# Patient Record
Sex: Female | Born: 1975 | Race: Asian | Hispanic: No | Marital: Married | Smoking: Never smoker
Health system: Southern US, Community
[De-identification: ages and names within clinical notes are randomized; demographics above are authoritative.]

## PROBLEM LIST (undated history)

## (undated) DIAGNOSIS — T7840XA Allergy, unspecified, initial encounter: Secondary | ICD-10-CM

## (undated) HISTORY — PX: BREAST EXCISIONAL BIOPSY: SUR124

## (undated) HISTORY — DX: Allergy, unspecified, initial encounter: T78.40XA

---

## 2002-02-22 ENCOUNTER — Other Ambulatory Visit: Admission: RE | Admit: 2002-02-22 | Discharge: 2002-02-22 | Payer: Self-pay | Admitting: Obstetrics and Gynecology

## 2003-03-08 ENCOUNTER — Other Ambulatory Visit: Admission: RE | Admit: 2003-03-08 | Discharge: 2003-03-08 | Payer: Self-pay | Admitting: Obstetrics and Gynecology

## 2003-08-19 ENCOUNTER — Encounter: Admission: RE | Admit: 2003-08-19 | Discharge: 2003-08-19 | Payer: Self-pay | Admitting: Family Medicine

## 2003-12-05 ENCOUNTER — Encounter: Admission: RE | Admit: 2003-12-05 | Discharge: 2003-12-05 | Payer: Self-pay | Admitting: Family Medicine

## 2009-06-26 ENCOUNTER — Inpatient Hospital Stay (HOSPITAL_COMMUNITY): Admission: AD | Admit: 2009-06-26 | Discharge: 2009-06-26 | Payer: Self-pay | Admitting: Obstetrics and Gynecology

## 2009-06-26 ENCOUNTER — Ambulatory Visit: Payer: Self-pay | Admitting: Nurse Practitioner

## 2009-08-18 ENCOUNTER — Inpatient Hospital Stay (HOSPITAL_COMMUNITY): Admission: AD | Admit: 2009-08-18 | Discharge: 2009-08-20 | Payer: Self-pay | Admitting: Obstetrics and Gynecology

## 2010-01-27 ENCOUNTER — Encounter: Payer: Self-pay | Admitting: Family Medicine

## 2010-03-23 LAB — CBC
HCT: 31.3 % — ABNORMAL LOW (ref 36.0–46.0)
HCT: 37.5 % (ref 36.0–46.0)
Hemoglobin: 10.6 g/dL — ABNORMAL LOW (ref 12.0–15.0)
Hemoglobin: 12.5 g/dL (ref 12.0–15.0)
MCH: 28.2 pg (ref 26.0–34.0)
MCH: 28.7 pg (ref 26.0–34.0)
MCHC: 33.3 g/dL (ref 30.0–36.0)
MCHC: 33.7 g/dL (ref 30.0–36.0)
MCV: 84.6 fL (ref 78.0–100.0)
MCV: 85 fL (ref 78.0–100.0)
Platelets: 171 10*3/uL (ref 150–400)
Platelets: 199 10*3/uL (ref 150–400)
RBC: 3.69 MIL/uL — ABNORMAL LOW (ref 3.87–5.11)
RBC: 4.43 MIL/uL (ref 3.87–5.11)
RDW: 14.6 % (ref 11.5–15.5)
RDW: 14.7 % (ref 11.5–15.5)
WBC: 10 10*3/uL (ref 4.0–10.5)
WBC: 9.6 10*3/uL (ref 4.0–10.5)

## 2010-03-23 LAB — RPR: RPR Ser Ql: NONREACTIVE

## 2010-03-25 LAB — FETAL FIBRONECTIN: Fetal Fibronectin: NEGATIVE

## 2012-09-06 ENCOUNTER — Ambulatory Visit (INDEPENDENT_AMBULATORY_CARE_PROVIDER_SITE_OTHER): Payer: BC Managed Care – PPO | Admitting: Family Medicine

## 2012-09-06 VITALS — BP 96/58 | HR 55 | Temp 98.8°F | Resp 16 | Ht 61.5 in | Wt 111.2 lb

## 2012-09-06 DIAGNOSIS — M26629 Arthralgia of temporomandibular joint, unspecified side: Secondary | ICD-10-CM

## 2012-09-06 DIAGNOSIS — M2669 Other specified disorders of temporomandibular joint: Secondary | ICD-10-CM

## 2012-09-06 MED ORDER — MELOXICAM 7.5 MG PO TABS: 7.5000 mg | ORAL_TABLET | Freq: Every day | ORAL | Status: DC

## 2012-09-06 NOTE — Progress Notes (Signed)
37 year old woman from [place]-does finger nail care comes in with 4 months of progressive bilateral TMJ pain. She says it hurts when she opens up her jaw but has no problem taking down. She's had some ear discomfort as well but no loss of hearing or dizziness.  Patient denies any trauma. She denies any fever. She denies any dental pain as well.  Objective: Normal dentition, full range of motion of jaw, minimally tender TMJ. Normal external ears and tympanic membranes Neck: Normal  Assessment: TMJ syndrome TMJ syndrome - Plan: meloxicam (MOBIC) 7.5 MG tablet  Signed, Elvina Sidle, MD

## 2012-09-06 NOTE — Patient Instructions (Signed)
Temporomandibular Problems  Temporomandibular joint (TMJ) dysfunction means there are problems with the joint between your jaw and your skull. This is a joint lined by cartilage like other joints in your body but also has a small disc in the joint which keeps the bones from rubbing on each other. These joints are like other joints and can get inflamed (sore) from arthritis and other problems. When this joint gets sore, it can cause headaches and pain in the jaw and the face. CAUSES  Usually the arthritic types of problems are caused by soreness in the joint. Soreness in the joint can also be caused by overuse. This may come from grinding your teeth. It may also come from mis-alignment in the joint. DIAGNOSIS Diagnosis of this condition can often be made by history and exam. Sometimes your caregiver may need X-rays or an MRI scan to determine the exact cause. It may be necessary to see your dentist to determine if your teeth and jaws are lined up correctly. TREATMENT  Most of the time this problem is not serious; however, sometimes it can persist (become chronic). When this happens medications that will cut down on inflammation (soreness) help. Sometimes a shot of cortisone into the joint will be helpful. If your teeth are not aligned it may help for your dentist to make a splint for your mouth that can help this problem. If no physical problems can be found, the problem may come from tension. If tension is found to be the cause, biofeedback or relaxation techniques may be helpful. HOME CARE INSTRUCTIONS   Later in the day, applications of ice packs may be helpful. Ice can be used in a plastic bag with a towel around it to prevent frostbite to skin. This may be used about every 2 hours for 20 to 30 minutes, as needed while awake, or as directed by your caregiver.  Only take over-the-counter or prescription medicines for pain, discomfort, or fever as directed by your caregiver.  If physical therapy was  prescribed, follow your caregiver's directions.  Wear mouth appliances as directed if they were given. Document Released: 09/18/2000 Document Revised: 03/18/2011 Document Reviewed: 12/27/2007 [employer] Patient Information 2014 [employer].  

## 2016-02-28 ENCOUNTER — Other Ambulatory Visit: Payer: Self-pay | Admitting: Obstetrics

## 2016-02-28 DIAGNOSIS — R928 Other abnormal and inconclusive findings on diagnostic imaging of breast: Secondary | ICD-10-CM

## 2016-03-01 ENCOUNTER — Ambulatory Visit
Admission: RE | Admit: 2016-03-01 | Discharge: 2016-03-01 | Disposition: A | Discharge status: Home / Self Care | Payer: Self-pay | Source: Ambulatory Visit | Attending: Obstetrics | Admitting: Obstetrics

## 2016-03-01 DIAGNOSIS — R928 Other abnormal and inconclusive findings on diagnostic imaging of breast: Secondary | ICD-10-CM

## 2016-10-22 ENCOUNTER — Ambulatory Visit (INDEPENDENT_AMBULATORY_CARE_PROVIDER_SITE_OTHER): Payer: BLUE CROSS/BLUE SHIELD

## 2016-10-22 ENCOUNTER — Encounter: Payer: Self-pay | Admitting: Family Medicine

## 2016-10-22 ENCOUNTER — Ambulatory Visit (INDEPENDENT_AMBULATORY_CARE_PROVIDER_SITE_OTHER): Payer: BLUE CROSS/BLUE SHIELD | Admitting: Family Medicine

## 2016-10-22 VITALS — BP 100/62 | HR 56 | Temp 98.2°F | Resp 18 | Ht 62.44 in | Wt 106.2 lb

## 2016-10-22 DIAGNOSIS — R071 Chest pain on breathing: Secondary | ICD-10-CM

## 2016-10-22 MED ORDER — RANITIDINE HCL 150 MG PO TABS: 150.0000 mg | ORAL_TABLET | Freq: Two times a day (BID) | ORAL | 1 refills | Status: AC

## 2016-10-22 NOTE — Patient Instructions (Addendum)
   IF you received an x-ray today, you will receive an invoice from [facility]. Please contact [facility] at [phone] with questions or concerns regarding your invoice.   IF you received labwork today, you will receive an invoice from [facility]. Please contact [facility] at [phone] with questions or concerns regarding your invoice.   Our billing staff will not be able to assist you with questions regarding bills from these companies.  You will be contacted with the lab results as soon as they are available. The fastest way to get your results is to activate your My Chart account. Instructions are located on the last page of this paperwork. If you have not heard from us regarding the results in 2 weeks, please contact this office.     Nonspecific Chest Pain Chest pain can be caused by many different conditions. There is always a chance that your pain could be related to something serious, such as a heart attack or a blood clot in your lungs. Chest pain can also be caused by conditions that are not life-threatening. If you have chest pain, it is very important to follow up with your health care provider. What are the causes? Causes of this condition include:  Heartburn.  Pneumonia or bronchitis.  Anxiety or stress.  Inflammation around your heart (pericarditis) or lung (pleuritis or pleurisy).  A blood clot in your lung.  A collapsed lung (pneumothorax). This can develop suddenly on its own (spontaneous pneumothorax) or from trauma to the chest.  Shingles infection (varicella-zoster virus).  Heart attack.  Damage to the bones, muscles, and cartilage that make up your chest wall. This can include: ? Bruised bones due to injury. ? Strained muscles or cartilage due to frequent or repeated coughing or overwork. ? Fracture to one or more ribs. ? Sore cartilage due to inflammation (costochondritis).  What increases the risk? Risk factors for this condition  may include:  Activities that increase your risk for trauma or injury to your chest.  Respiratory infections or conditions that cause frequent coughing.  Medical conditions or overeating that can cause heartburn.  Heart disease or family history of heart disease.  Conditions or health behaviors that increase your risk of developing a blood clot.  Having had chicken pox (varicella zoster).  What are the signs or symptoms? Chest pain can feel like:  Burning or tingling on the surface of your chest or deep in your chest.  Crushing, pressure, aching, or squeezing pain.  Dull or sharp pain that is worse when you move, cough, or take a deep breath.  Pain that is also felt in your back, neck, shoulder, or arm, or pain that spreads to any of these areas.  Your chest pain may come and go, or it may stay constant. How is this diagnosed? Lab tests or other studies may be needed to find the cause of your pain. Your health care provider may have you take a test called an ECG (electrocardiogram). An ECG records your heartbeat patterns at the time the test is performed. You may also have other tests, such as:  Transthoracic echocardiogram (TTE). In this test, sound waves are used to create a picture of the heart structures and to look at how blood flows through your heart.  Transesophageal echocardiogram (TEE).This is a more advanced imaging test that takes images from inside your body. It allows your health care provider to see your heart in finer detail.  Cardiac monitoring. This allows your health care provider to   monitor your heart rate and rhythm in real time.  Holter monitor. This is a portable device that records your heartbeat and can help to diagnose abnormal heartbeats. It allows your health care provider to track your heart activity for several days, if needed.  Stress tests. These can be done through exercise or by taking medicine that makes your heart beat more quickly.  Blood  tests.  Other imaging tests.  How is this treated? Treatment depends on what is causing your chest pain. Treatment may include:  Medicines. These may include: ? Acid blockers for heartburn. ? Anti-inflammatory medicine. ? Pain medicine for inflammatory conditions. ? Antibiotic medicine, if an infection is present. ? Medicines to dissolve blood clots. ? Medicines to treat coronary artery disease (CAD).  Supportive care for conditions that do not require medicines. This may include: ? Resting. ? Applying heat or cold packs to injured areas. ? Limiting activities until pain decreases.  Follow these instructions at home: Medicines  If you were prescribed an antibiotic, take it as told by your health care provider. Do not stop taking the antibiotic even if you start to feel better.  Take over-the-counter and prescription medicines only as told by your health care provider. Lifestyle  Do not use any products that contain nicotine or tobacco, such as cigarettes and e-cigarettes. If you need help quitting, ask your health care provider.  Do not drink alcohol.  Make lifestyle changes as directed by your health care provider. These may include: ? Getting regular exercise. Ask your health care provider to suggest some activities that are safe for you. ? Eating a heart-healthy diet. A registered dietitian can help you to learn healthy eating options. ? Maintaining a healthy weight. ? Managing diabetes, if necessary. ? Reducing stress, such as with yoga or relaxation techniques. General instructions  Avoid any activities that bring on chest pain.  If heartburn is the cause for your chest pain, raise (elevate) the head of your bed about 6 inches (15 cm) by putting blocks under the legs. Sleeping with more pillows does not effectively relieve heartburn because it only changes the position of your head.  Keep all follow-up visits as told by your health care provider. This is important.  This includes any further testing if your chest pain does not go away. Contact a health care provider if:  Your chest pain does not go away.  You have a rash with blisters on your chest.  You have a fever.  You have chills. Get help right away if:  Your chest pain is worse.  You have a cough that gets worse, or you cough up blood.  You have severe pain in your abdomen.  You have severe weakness.  You faint.  You have sudden, unexplained chest discomfort.  You have sudden, unexplained discomfort in your arms, back, neck, or jaw.  You have shortness of breath at any time.  You suddenly start to sweat, or your skin gets clammy.  You feel nauseous or you vomit.  You suddenly feel light-headed or dizzy.  Your heart begins to beat quickly, or it feels like it is skipping beats. These symptoms may represent a serious problem that is an emergency. Do not wait to see if the symptoms will go away. Get medical help right away. Call your local emergency services (911 in the [place] Do not drive yourself to the hospital. This information is not intended to replace advice given to you by your health care provider. Make sure you   discuss any questions you have with your health care provider. Document Released: 10/03/2004 Document Revised: 09/18/2015 Document Reviewed: 09/18/2015 Elsevier Interactive Patient Education  2017 [employer]  

## 2016-10-22 NOTE — Progress Notes (Signed)
10/16/20189:03 AM  Misty Perkins 1975-11-15, 41 y.o. female [id number]  Chief Complaint  Patient presents with  . Chest Pain    X 2 weeks off and on    HPI:   Patient is a 41 y.o. female with no past medical history  who presents today for intermittent chest pressure, midsternal/upper left with deep breathing or sometimes when she wakes up. Denies any symptoms with exertion. Radiates to her back. Self -resolves, fleeting. Sometimes she gets dizzy, she denies any nausea or diaphoresis, She occ has a dry nocturnal cough. Asthma in childhood. Denies heartburn, abd pain, changes in BM, blood in stool. She occ has palpitations but has not associated it with chest pressure. Long car trip 2-3 months ago. Denies any leg edema. Has not tried anything for this. Denies any recent illnesses.   Depression screen PHQ 2/9 10/22/2016  Decreased Interest 0  Down, Depressed, Hopeless 0  PHQ - 2 Score 0    No Known Allergies  Prior to Admission medications   Not on File    Past Medical History:  Diagnosis Date  . Allergy     Past Surgical History:  Procedure Laterality Date  . BREAST EXCISIONAL BIOPSY Right     Social History  Substance Use Topics  . Smoking status: Never Smoker  . Smokeless tobacco: Never Used  . Alcohol use No    Family History  Problem Relation Age of Onset  . Hypertension Father     Review of Systems  Constitutional: Negative for chills, diaphoresis and fever.  Respiratory: Positive for cough and shortness of breath. Negative for hemoptysis, sputum production and wheezing.   Cardiovascular: Positive for chest pain and palpitations. Negative for leg swelling.  Gastrointestinal: Negative for abdominal pain, blood in stool, constipation, diarrhea, heartburn, nausea and vomiting.  Neurological: Positive for dizziness. Negative for sensory change, speech change and focal weakness.     OBJECTIVE:  Blood pressure 100/62, pulse (!) 56, temperature 98.2 F  (36.8 C), temperature source Temporal, resp. rate 18, height 5' 2.44" (1.586 m), weight 106 lb 3.2 oz (48.2 kg), last menstrual period 10/20/2016, SpO2 98 %. Compared to Vital signs in 2014, BP and HR stable  Physical Exam  Constitutional: She is oriented to person, place, and time and well-developed, well-nourished, and in no distress.  HENT:  Head: Normocephalic and atraumatic.  Mouth/Throat: Oropharynx is clear and moist. No oropharyngeal exudate.  Eyes: Pupils are equal, round, and reactive to light. EOM are normal. No scleral icterus.  Neck: Neck supple. No thyromegaly present.  Cardiovascular: Normal rate, regular rhythm, normal heart sounds and intact distal pulses.  Exam reveals no gallop and no friction rub.   No murmur heard. Pulmonary/Chest: Effort normal and breath sounds normal. She has no wheezes. She has no rales. She exhibits no tenderness.  Abdominal: Soft. Bowel sounds are normal. She exhibits no distension and no mass. There is no tenderness.  Musculoskeletal: She exhibits no edema.  Lymphadenopathy:    She has no cervical adenopathy.  Neurological: She is alert and oriented to person, place, and time. Gait normal.  Skin: Skin is warm and dry.     Dg Chest 2 View  Result Date: 10/22/2016 CLINICAL DATA:  Intermittent chest pressure. EXAM: CHEST  2 VIEW COMPARISON:  No recent. FINDINGS: Mediastinum and hilar structures normal. Lungs are clear. Heart size normal. No pleural effusion or pneumothorax. IMPRESSION: No acute cardiopulmonary disease . Electronically Signed   By: Maisie Fus  Register   On: 10/22/2016  09:38    EKG: sinus brady, HR 50   ASSESSMENT and PLAN  1. Chest pain on breathing Exam, CXR and EKG normal. Discussed atypical causes of CP, trial of H2B, discussed diet. Labs pending, fu if needed per labs. Discussed ER precautions.   - EKG 12-Lead - CBC with Differential - Comprehensive metabolic panel - TSH - DG Chest 2 View; Future  Other orders -  ranitidine (ZANTAC) 150 MG tablet; Take 1 tablet (150 mg total) by mouth 2 (two) times daily.  Return if symptoms worsen or fail to improve.    Myles Lipps, MD [facility] [address] [place], [place] [address] Ph.  [phone] Fax [phone]

## 2016-10-23 LAB — COMPREHENSIVE METABOLIC PANEL
ALT: 12 IU/L (ref 0–32)
AST: 20 IU/L (ref 0–40)
Albumin/Globulin Ratio: 1.9 (ref 1.2–2.2)
Albumin: 5 g/dL (ref 3.5–5.5)
Alkaline Phosphatase: 50 IU/L (ref 39–117)
BUN/Creatinine Ratio: 19 (ref 9–23)
BUN: 12 mg/dL (ref 6–24)
Bilirubin Total: 0.9 mg/dL (ref 0.0–1.2)
CO2: 25 mmol/L (ref 20–29)
Calcium: 9.4 mg/dL (ref 8.7–10.2)
Chloride: 101 mmol/L (ref 96–106)
Creatinine, Ser: 0.63 mg/dL (ref 0.57–1.00)
GFR calc Af Amer: 127 mL/min/{1.73_m2} (ref >59)
GFR calc non Af Amer: 110 mL/min/{1.73_m2} (ref >59)
Globulin, Total: 2.7 g/dL (ref 1.5–4.5)
Glucose: 75 mg/dL (ref 65–99)
Potassium: 3.6 mmol/L (ref 3.5–5.2)
Sodium: 143 mmol/L (ref 134–144)
Total Protein: 7.7 g/dL (ref 6.0–8.5)

## 2016-10-23 LAB — CBC WITH DIFFERENTIAL/PLATELET
Basophils Absolute: 0 10*3/uL (ref 0.0–0.2)
Basos: 1 %
EOS (ABSOLUTE): 0 10*3/uL (ref 0.0–0.4)
Eos: 1 %
Hematocrit: 41.4 % (ref 34.0–46.6)
Hemoglobin: 13.5 g/dL (ref 11.1–15.9)
Immature Grans (Abs): 0 10*3/uL (ref 0.0–0.1)
Immature Granulocytes: 0 %
Lymphocytes Absolute: 2 10*3/uL (ref 0.7–3.1)
Lymphs: 48 %
MCH: 28.1 pg (ref 26.6–33.0)
MCHC: 32.6 g/dL (ref 31.5–35.7)
MCV: 86 fL (ref 79–97)
Monocytes Absolute: 0.2 10*3/uL (ref 0.1–0.9)
Monocytes: 5 %
Neutrophils Absolute: 1.8 10*3/uL (ref 1.4–7.0)
Neutrophils: 45 %
Platelets: 191 10*3/uL (ref 150–379)
RBC: 4.81 x10E6/uL (ref 3.77–5.28)
RDW: 12.9 % (ref 12.3–15.4)
WBC: 4 10*3/uL (ref 3.4–10.8)

## 2016-10-23 LAB — TSH: TSH: 3.04 u[IU]/mL (ref 0.450–4.500)

## 2016-10-24 ENCOUNTER — Encounter: Payer: Self-pay | Admitting: Family Medicine

## 2018-11-02 IMAGING — DX DG CHEST 2V
2 series · 2 of 2 positions shown · non-contrast
Comparison: No recent.

CLINICAL DATA: Intermittent chest pressure.

EXAM:
CHEST  2 VIEW

[chest pa]
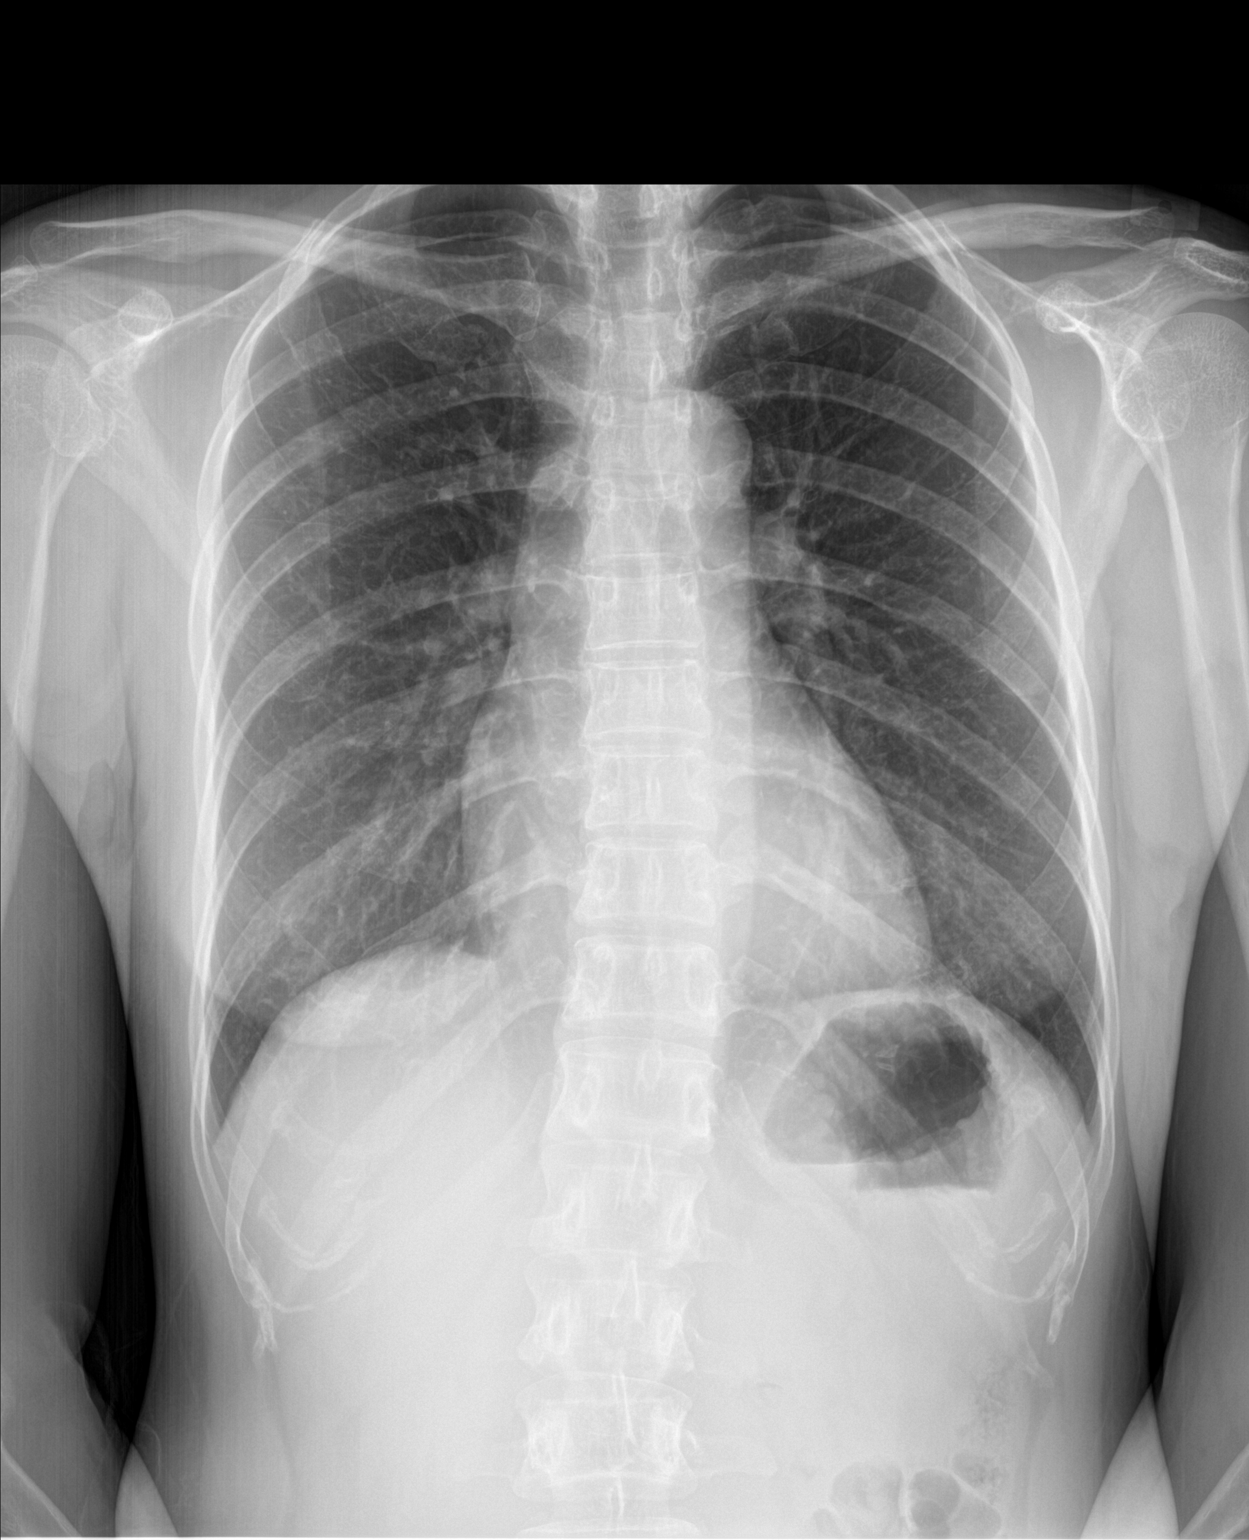

[chest lat]
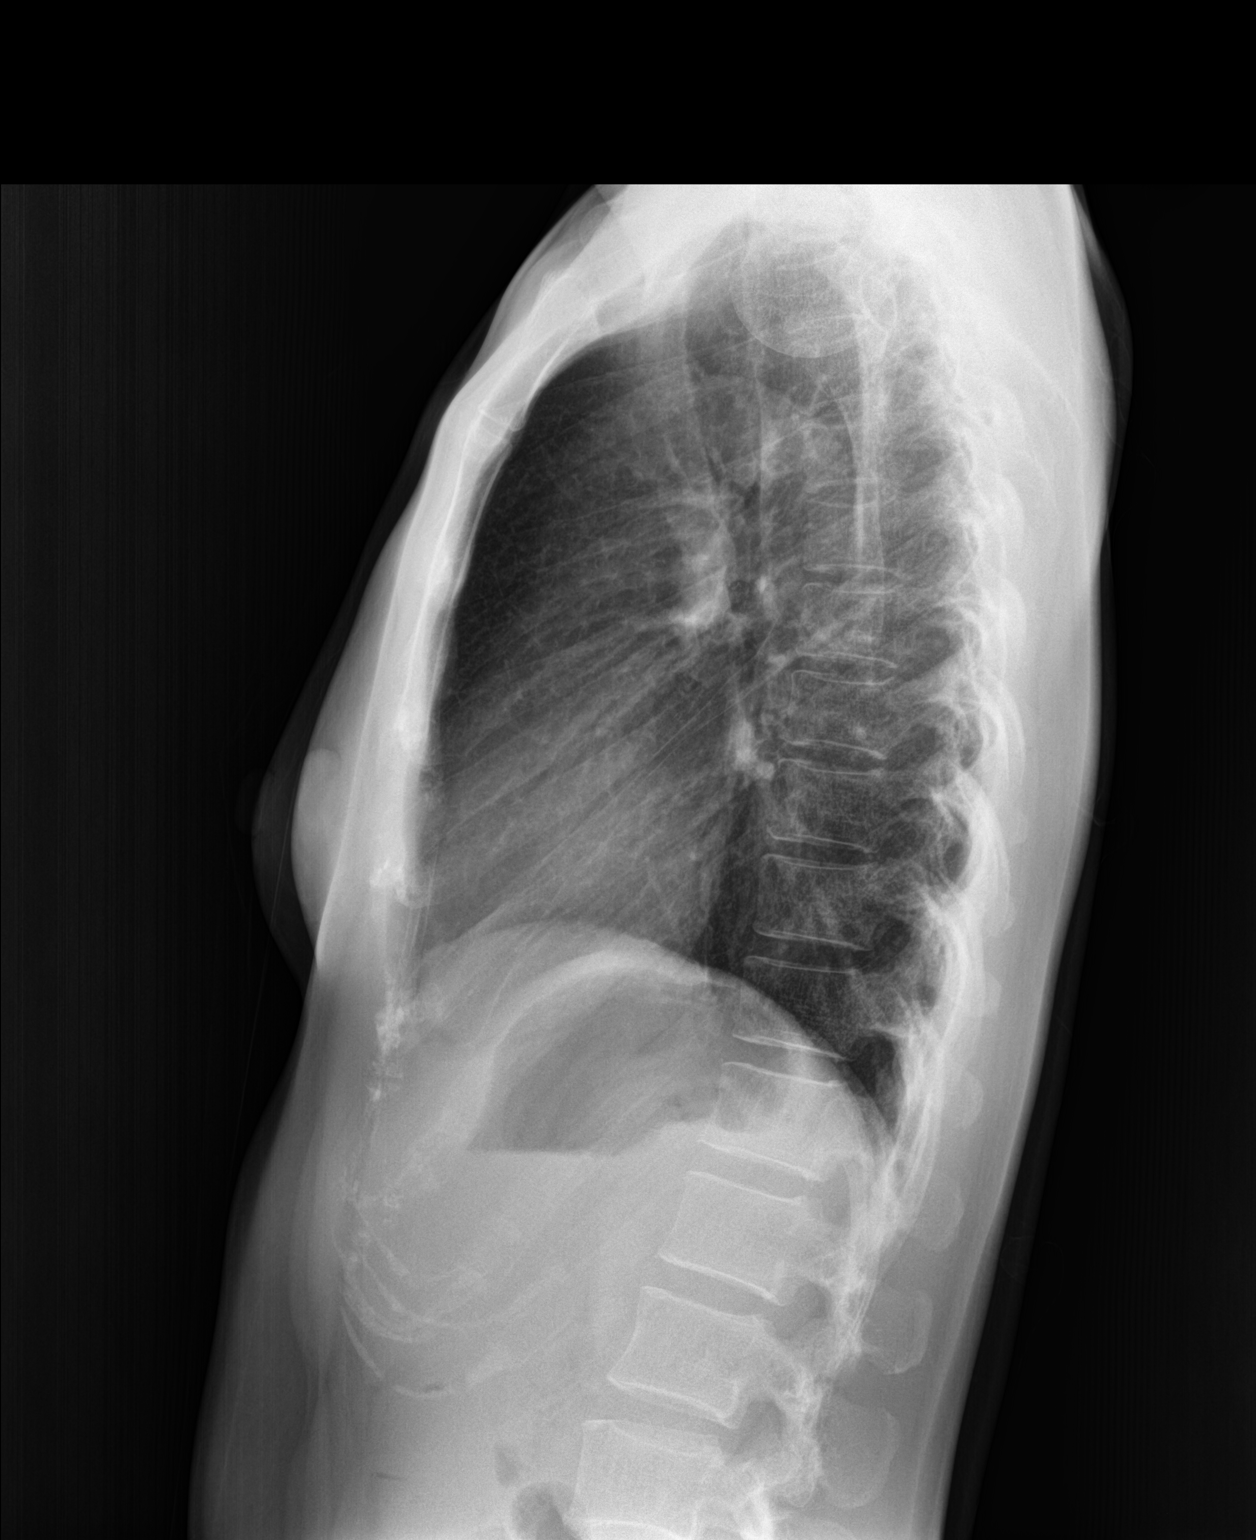

[2 of 2 positions shown; findings below may reference images not displayed]

FINDINGS: Mediastinum and hilar structures normal. Lungs are clear. Heart size
normal. No pleural effusion or pneumothorax.
IMPRESSION: No acute cardiopulmonary disease .

## 2022-02-17 NOTE — Progress Notes (Unsigned)
New Patient Note  RE: Misty Perkins MRN: [id number] DOB: 1975-03-11 Date of Office Visit: 02/18/2022  Consult requested by: No ref. provider found Primary care provider: Patient, No Pcp Per  Chief Complaint: Rash, Pruritus, and Urticaria (Pt c/o x3 month experiencing red itchy bumps on body after shower. )  History of Present Illness: I had the pleasure of seeing Misty Perkins for initial evaluation at the [facility] on 02/18/2022. She is a 47 y.o. female, who is self-referred here for the evaluation of rash.  Rash started about 4+ months ago. This can occur anywhere on her body. Describes them as itchy, tiny little red bumps. Individual rashes lasts about less than 1 day. No ecchymosis upon resolution. Associated symptoms include: none.  Frequency of episodes: initially had it daily but now occurs about 1-2 times per week. Suspected triggers are hot showers. Denies any fevers, chills, changes in medications, foods, personal care products or recent infections. She has tried the following therapies: ketoconazole shampoo, cortisone cream and Claritin with minimal benefit.   Previous work up includes: went to urgent care. Previous history of rash/hives: itching in the past? Patient is up to date with the following cancer screening tests: no recent bloodwork. Up to date with mammogram. Patient follows with ob/gyn annually.  Assessment and Plan: Misty Perkins is a 47 y.o. female with: Rash and other nonspecific skin eruption Pruritic rash x 4+ months. Worse after hot showers but now improved. Tried cortisone cream and Claritin with minimal benefit. Denies changes in diet, meds, personal care products.  Today's skin prick testing showed: Positive to dust mites, cat and tree pollen. Negative to common foods.  Discussed with patient that I doubt the above allergens are causing her symptoms. 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  Start allegra 165m once a day. May take  twice a day if needed.  If symptoms are not controlled or causes drowsiness let u[place]ore[place]now. Avoid the following potential triggers: alcohol, tight clothing, NSAIDs, hot showers and getting overheated. See below for proper skin care. Keep track of episodes and take pictures.   No need to use the shampoo anymore - this does not look like a fungal rash.  Get bloodwork if no improvement in 1 month - declined bloodwork today as she wants to try the allegra first.   Other allergic rhinitis Mild symptoms in the fall and spring. Today's skin testing: Positive to dust mites, cat and tree pollen. Start environmental control measures as below. The antihistamines as above should also help with these symptoms.  Return in about 4 weeks (around 03/18/2022).  Recommend establishing care with a PCP.  No orders of the defined types were placed in this encounter.  Lab Orders  No laboratory test(s) ordered today    Other allergy screening: Asthma: no Rhino conjunctivitis:  Sometimes has symptoms in the fall and spring.  Food allergy: no Medication allergy: no Hymenoptera allergy: no History of recurrent infections suggestive of immunodeficency: no  Diagnostics: Skin Testing: Environmental allergy panel and select foods. Positive to dust mites, cat and tree pollen. Negative to common foods.  Results discussed with patient/family.  Airborne Adult Perc - 02/18/22 1000     Time Antigen Placed 1014    Allergen Manufacturer GLavella Hammock   Location Back    Number of Test 59    1. Control-Buffer 50% Glycerol Negative    2. Control-Histamine 1 mg/ml 2+    3. Albumin saline Negative  4. Big [place] Negative    5. [place] Negative    6. Johnson Negative    7. [place] Blue Negative    8. Meadow Fescue Negative    9. Perennial Rye Negative    10. Sweet Vernal Negative    11. Timothy Negative    12. Cocklebur Negative    13. Burweed Marshelder Negative    14. Ragweed, short Negative    15. Ragweed, Giant  Negative    16. Plantain,  English Negative    17. Lamb's Quarters Negative    18. Sheep Sorrell Negative    19. Rough Pigweed Negative    20. Marsh Elder, Rough Negative    21. Mugwort, Common Negative    22. Ash mix 2+    23. Birch mix Negative    24. Beech American Negative    25. Box, Elder Negative    26. Cedar, red Negative    27. Cottonwood, [place] Negative    28. Elm mix Negative    29. Hickory Negative    30. Maple mix Negative    31. Oak, [place] mix Negative    32. Pecan Pollen Negative    33. Pine mix Negative    34. Sycamore Eastern Negative    35. Pleasant Garden, Black Pollen Negative    36. Alternaria alternata Negative    37. Cladosporium Herbarum Negative    38. Aspergillus mix Negative    39. Penicillium mix Negative    40. Bipolaris sorokiniana (Helminthosporium) Negative    41. Drechslera spicifera (Curvularia) Negative    42. Mucor plumbeus Negative    43. Fusarium moniliforme Negative    44. Aureobasidium pullulans (pullulara) Negative    45. Rhizopus oryzae Negative    46. Botrytis cinera Negative    47. Epicoccum nigrum Negative    48. Phoma betae Negative    49. Candida Albicans Negative    50. Trichophyton mentagrophytes Negative    51. Mite, D Farinae  5,000 AU/ml 2+    52. Mite, D Pteronyssinus  5,000 AU/ml 2+    53. Cat Hair 10,000 BAU/ml 2+    54.  Dog Epithelia Negative    55. Mixed Feathers Negative    56. Horse Epithelia Negative    57. Cockroach, German Negative    58. Mouse Negative    59. Tobacco Leaf Negative             Food Adult Perc - 02/18/22 1000     Time Antigen Placed 1015    Allergen Manufacturer Lavella Hammock    Location Back    Number of allergen test 10    1. Peanut Negative    2. Soybean Negative    3. Wheat Negative    4. Sesame Negative    5. Milk, cow Negative    6. Egg White, Chicken Negative    7. Casein Negative    8. Shellfish Mix Negative    9. Fish Mix Negative    10. Cashew Negative             Past  Medical History: Patient Active Problem List   Diagnosis Date Noted   Rash and other nonspecific skin eruption 02/18/2022   Other allergic rhinitis 02/18/2022   Past Medical History:  Diagnosis Date   Allergy    Past Surgical History: Past Surgical History:  Procedure Laterality Date   BREAST EXCISIONAL BIOPSY Right    Medication List:  Current Outpatient Medications  Medication Sig Dispense Refill   ketoconazole (NIZORAL) 2 %  shampoo Apply topically 2 (two) times a week.     loratadine (CLARITIN) 10 MG tablet Take 10 mg by mouth daily.     nystatin-triamcinolone ointment (MYCOLOG) Apply topically 2 (two) times daily.     ranitidine (ZANTAC) 150 MG tablet Take 1 tablet (150 mg total) by mouth 2 (two) times daily. (Patient not taking: Reported on 02/18/2022) 60 tablet 1   No current facility-administered medications for this visit.   Allergies: No Known Allergies Social History: Social History   Socioeconomic History   Marital status: Married    Spouse name: Not on file   Number of children: Not on file   Years of education: Not on file   Highest education level: Not on file  Occupational History   Not on file  Tobacco Use   Smoking status: Never    Passive exposure: Never   Smokeless tobacco: Never  Vaping Use   Vaping Use: Never used  Substance and Sexual Activity   Alcohol use: No   Drug use: No   Sexual activity: Not on file  Other Topics Concern   Not on file  Social History Narrative   Not on file   Social Determinants of Health   Financial Resource Strain: Not on file  Food Insecurity: Not on file  Transportation Needs: Not on file  Physical Activity: Not on file  Stress: Not on file  Social Connections: Not on file   Lives in a 49 year old house. Smoking: denies Occupation: Customer service manager History: Water Damage/mildew in the house: no Carpet in the family room: no Carpet in the bedroom: no Heating: gas Cooling: central Pet:  no  Family History: Family History  Problem Relation Age of Onset   Hypertension Father    Problem                               Relation Food allergy                          Children   Review of Systems  Constitutional:  Negative for appetite change, chills, fever and unexpected weight change.  HENT:  Negative for congestion and rhinorrhea.   Eyes:  Negative for itching.  Respiratory:  Negative for cough, chest tightness, shortness of breath and wheezing.   Cardiovascular:  Negative for chest pain.  Gastrointestinal:  Negative for abdominal pain.  Genitourinary:  Negative for difficulty urinating.  Skin:  Positive for rash.       Pruritus  Allergic/Immunologic: Positive for environmental allergies. Negative for food allergies.  Neurological:  Negative for headaches.    Objective: BP 90/70   Pulse 69   Temp 98.2 F (36.8 C)   Resp 16   Ht 5' 2.44" (1.586 m)   Wt 114 lb (51.7 kg)   SpO2 99%   BMI 20.56 kg/m  Body mass index is 20.56 kg/m. Physical Exam Vitals and nursing note reviewed.  Constitutional:      Appearance: Normal appearance. She is well-developed.  HENT:     Head: Normocephalic and atraumatic.     Right Ear: Tympanic membrane and external ear normal.     Left Ear: Tympanic membrane and external ear normal.     Nose: Nose normal.     Mouth/Throat:     Mouth: Mucous membranes are moist.     Pharynx: Oropharynx is clear.  Eyes:  Conjunctiva/sclera: Conjunctivae normal.  Cardiovascular:     Rate and Rhythm: Normal rate and regular rhythm.     Heart sounds: Normal heart sounds. No murmur heard.    No friction rub. No gallop.  Pulmonary:     Effort: Pulmonary effort is normal.     Breath sounds: Normal breath sounds. No wheezing, rhonchi or rales.  Musculoskeletal:     Cervical back: Neck supple.  Skin:    General: Skin is warm.     Findings: No rash.  Neurological:     Mental Status: She is alert and oriented to person, place, and time.   Psychiatric:        Behavior: Behavior normal.   The plan was reviewed with the patient/family, and all questions/concerned were addressed.  It was my pleasure to see Misty Perkins today and participate in her care. Please feel free to contact me with any questions or concerns.  Sincerely,  Rexene Alberts, DO Allergy & Immunology  [facility] office: [place] office: [phone]

## 2022-02-18 ENCOUNTER — Other Ambulatory Visit: Payer: Self-pay

## 2022-02-18 ENCOUNTER — Encounter: Payer: Self-pay | Admitting: Allergy

## 2022-02-18 ENCOUNTER — Ambulatory Visit (INDEPENDENT_AMBULATORY_CARE_PROVIDER_SITE_OTHER): Payer: Commercial Managed Care - HMO | Admitting: Allergy

## 2022-02-18 VITALS — BP 90/70 | HR 69 | Temp 98.2°F | Resp 16 | Ht 62.44 in | Wt 114.0 lb

## 2022-02-18 DIAGNOSIS — L299 Pruritus, unspecified: Secondary | ICD-10-CM

## 2022-02-18 DIAGNOSIS — R21 Rash and other nonspecific skin eruption: Secondary | ICD-10-CM | POA: Diagnosis not present

## 2022-02-18 DIAGNOSIS — J3089 Other allergic rhinitis: Secondary | ICD-10-CM

## 2022-02-18 NOTE — Patient Instructions (Addendum)
Today's skin testing showed: Positive to dust mites, cat and tree pollen. Negative to common foods.   Results given.  Rash:  Start allegra 164m once a day. May take twice a day if needed.  If symptoms are not controlled or causes drowsiness let u[place]ore[place]now. Avoid the following potential triggers: alcohol, tight clothing, NSAIDs, hot showers and getting overheated. See below for proper skin care. Keep track of episodes and take pictures.   No need to use the shampoo anymore - this does not look like a fungal rash.  Get bloodwork if no improvement in 1 month.   Environmental allergies Start environmental control measures as below.  Follow up in 1 months or sooner if needed.   Recommend establishing care with a PCP.  Control of House Dust Mite Allergen Dust mite allergens are a common trigger of allergy and asthma symptoms. While they can be found throughout the house, these microscopic creatures thrive in warm, humid environments such as bedding, upholstered furniture and carpeting. Because so much time is spent in the bedroom, it is essential to reduce mite levels there.  Encase pillows, mattresses, and box springs in special allergen-proof fabric covers or airtight, zippered plastic covers.  Bedding should be washed weekly in hot water (130 F) and dried in a hot dryer. Allergen-proof covers are available for comforters and pillows that can't be regularly washed.  Wash the allergy-proof covers every few months. Minimize clutter in the bedroom. Keep pets out of the bedroom.  Keep humidity less than 50% by using a dehumidifier or air conditioning. You can buy a humidity measuring device called a hygrometer to monitor this.  If possible, replace carpets with hardwood, linoleum, or washable area rugs. If that's not possible, vacuum frequently with a vacuum that has a HEPA filter. Remove all upholstered furniture and non-washable window drapes from the bedroom. Remove all non-washable stuffed  toys from the bedroom.  Wash stuffed toys weekly. Pet Allergen Avoidance: Contrary to popular opinion, there are no "hypoallergenic" breeds of dogs or cats. That is because people are not allergic to an animal's hair, but to an allergen found in the animal's saliva, dander (dead skin flakes) or urine. Pet allergy symptoms typically occur within minutes. For some people, symptoms can build up and become most severe 8 to 12 hours after contact with the animal. People with severe allergies can experience reactions in public places if dander has been transported on the pet owners' clothing. Keeping an animal outdoors is only a partial solution, since homes with pets in the yard still have higher concentrations of animal allergens. Before getting a pet, ask your allergist to determine if you are allergic to animals. If your pet is already considered part of your family, try to minimize contact and keep the pet out of the bedroom and other rooms where you spend a great deal of time. As with dust mites, vacuum carpets often or replace carpet with a hardwood floor, tile or linoleum. High-efficiency particulate air (HEPA) cleaners can reduce allergen levels over time. While dander and saliva are the source of cat and dog allergens, urine is the source of allergens from rabbits, hamsters, mice and gDenmarkpigs; so ask a non-allergic family member to clean the animal's cage. If you have a pet allergy, talk to your allergist about the potential for allergy immunotherapy (allergy shots). This strategy can often provide long-term relief. Reducing Pollen Exposure Pollen seasons: trees (spring), grass (summer) and ragweed/weeds (fall). Keep windows closed in your home and  car to lower pollen exposure.  Install air conditioning in the bedroom and throughout the house if possible.  Avoid going out in dry windy days - especially early morning. Pollen counts are highest between 5 - 10 AM and on dry, hot and windy days.   Save outside activities for late afternoon or after a heavy rain, when pollen levels are lower.  Avoid mowing of grass if you have grass pollen allergy. Be aware that pollen can also be transported indoors on people and pets.  Dry your clothes in an automatic dryer rather than hanging them outside where they might collect pollen.  Rinse hair and eyes before bedtime.   Skin care recommendations  Bath time: Always use lukewarm water. AVOID very hot or cold water. Keep bathing time to 5-10 minutes. Do NOT use bubble bath. Use a mild soap and use just enough to wash the dirty areas. Do NOT scrub skin vigorously.  After bathing, pat dry your skin with a towel. Do NOT rub or scrub the skin.  Moisturizers and prescriptions:  ALWAYS apply moisturizers immediately after bathing (within 3 minutes). This helps to lock-in moisture. Use the moisturizer several times a day over the whole body. Good summer moisturizers include: Aveeno, CeraVe, Cetaphil. Good winter moisturizers include: Aquaphor, Vaseline, Cerave, Cetaphil, Eucerin, Vanicream. When using moisturizers along with medications, the moisturizer should be applied about one hour after applying the medication to prevent diluting effect of the medication or moisturize around where you applied the medications. When not using medications, the moisturizer can be continued twice daily as maintenance.  Laundry and clothing: Avoid laundry products with added color or perfumes. Use unscented hypo-allergenic laundry products such as Tide free, Cheer free & gentle, and All free and clear.  If the skin still seems dry or sensitive, you can try double-rinsing the clothes. Avoid tight or scratchy clothing such as wool. Do not use fabric softeners or dyer sheets.

## 2022-02-18 NOTE — Assessment & Plan Note (Signed)
Pruritic rash x 4+ months. Worse after hot showers but now improved. Tried cortisone cream and Claritin with minimal benefit. Denies changes in diet, meds, personal care products.  Today's skin prick testing showed: Positive to dust mites, cat and tree pollen. Negative to common foods.  Discussed with patient that I doubt the above allergens are causing her symptoms. 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  Start allegra 165m once a day. May take twice a day if needed.  If symptoms are not controlled or causes drowsiness let uKore[place]now. Avoid the following potential triggers: alcohol, tight clothing, NSAIDs, hot showers and getting overheated. See below for proper skin care. Keep track of episodes and take pictures.   No need to use the shampoo anymore - this does not look like a fungal rash.  Get bloodwork if no improvement in 1 month - declined bloodwork today as she wants to try the allegra first.

## 2022-02-18 NOTE — Assessment & Plan Note (Signed)
Mild symptoms in the fall and spring. Today's skin testing: Positive to dust mites, cat and tree pollen. Start environmental control measures as below. The antihistamines as above should also help with these symptoms.
